# Patient Record
Sex: Female | Born: 1955 | Race: White | Hispanic: No | Marital: Married | Smoking: Current every day smoker
Health system: Southern US, Community
[De-identification: ages and names within clinical notes are randomized; demographics above are authoritative.]

## PROBLEM LIST (undated history)

## (undated) DIAGNOSIS — E079 Disorder of thyroid, unspecified: Secondary | ICD-10-CM

## (undated) HISTORY — PX: OTHER SURGICAL HISTORY: SHX169

## (undated) HISTORY — PX: THYROID SURGERY: SHX805

---

## 2015-10-10 ENCOUNTER — Ambulatory Visit: Payer: Self-pay | Attending: Internal Medicine

## 2015-10-10 ENCOUNTER — Ambulatory Visit (HOSPITAL_COMMUNITY)
Admission: EM | Admit: 2015-10-10 | Discharge: 2015-10-10 | Disposition: A | Discharge status: Home / Self Care | Payer: Self-pay | Attending: Emergency Medicine | Admitting: Emergency Medicine

## 2015-10-10 ENCOUNTER — Encounter (HOSPITAL_COMMUNITY): Payer: Self-pay | Admitting: *Deleted

## 2015-10-10 DIAGNOSIS — E890 Postprocedural hypothyroidism: Secondary | ICD-10-CM

## 2015-10-10 HISTORY — DX: Disorder of thyroid, unspecified: E07.9

## 2015-10-10 MED ORDER — THYROID 300 MG PO TABS: 300.0000 mg | ORAL_TABLET | Freq: Every day | ORAL | Status: DC

## 2015-10-10 NOTE — ED Notes (Signed)
Pt  Reports   She is  Out  Of  Her  thyriod  meds        She  Reports she  Has  Not  Had  Any  In  One month       She  Reports   Some  Swelling  Of  Her  Face        She  Is  Awake  And  Alert  And  Oriented

## 2015-10-10 NOTE — ED Provider Notes (Signed)
CSN: [id number]     Arrival date & time 10/10/15  1251 History   First MD Initiated Contact with Patient 10/10/15 1330     Chief Complaint  Patient presents with  . Medication Refill   (Consider location/radiation/quality/duration/timing/severity/associated sxs/prior Treatment) HPI Comments: 60 year old female who has a history of postsurgical hypothyroidism presents to the urgent care to have her refill of her thyroid supplement medication. She had been seeing he will physicians before various nonspecific reasons she is no longer seeing them again she [place] approximately 2 and half to 3 months ago and had lab drawn. She called back to get her prescription refilled from that provider and was told that she would have to be seen within 3 months and would be called back. Patient states she was never called back. She states she has no insurance and does not have the funds to have laboratory obtained or to pay a PCP at this time. Her only complaint is that of sleeping more than usual and a decrease in energy level. Her medication is armour thyroid 300 mg a day.   Past Medical History  Diagnosis Date  . Thyroid disease    Past Surgical History  Procedure Laterality Date  . Thyroid surgery    . Btl     History reviewed. No pertinent family history. Social History  Substance Use Topics  . Smoking status: Current Every Day Smoker  . Smokeless tobacco: None  . Alcohol Use: Yes   OB History    No data available     Review of Systems  Constitutional: Positive for activity change and fatigue. Negative for fever.  HENT: Negative.   Respiratory: Negative.   Cardiovascular: Negative.  Negative for leg swelling.  Endocrine: Negative for cold intolerance and heat intolerance.  Skin: Negative.   Neurological: Negative.   All other systems reviewed and are negative.   Allergies  Review of patient's allergies indicates no known allergies.  Home  Medications   Prior to Admission medications   Medication Sig Start Date End Date Taking? Authorizing Provider  thyroid (ARMOUR THYROID) 300 MG tablet Take 1 tablet (300 mg total) by mouth daily. 10/10/15   Hayden Rasmussenavid Amaryah Mallen, NP   Meds Ordered and Administered this Visit  Medications - No data to display  BP 120/75 mmHg  Pulse 78  Temp(Src) 98.1 F (36.7 C) (Oral)  Resp 18  Ht 5\' 10"  (1.778 m)  Wt 210 lb (95.255 kg)  BMI 30.13 kg/m2  SpO2 96% No data found.   Physical Exam  Constitutional: She is oriented to person, place, and time. She appears well-developed and well-nourished. No distress.  HENT:  Transverse scar across the lower anterior neck. No palpable nodules or tenderness. No anterior neck swelling.  Eyes: EOM are normal.  Neck: Normal range of motion. Neck supple.  Cardiovascular: Normal rate, regular rhythm, normal heart sounds and intact distal pulses.   Pulmonary/Chest: Effort normal and breath sounds normal. No respiratory distress.  Musculoskeletal: She exhibits no edema.  Neurological: She is alert and oriented to person, place, and time. She exhibits normal muscle tone.  Skin: Skin is warm and dry.  Psychiatric: She has a normal mood and affect.  Nursing note and vitals reviewed.   ED Course  Procedures (including critical care time)  Labs Review Labs Reviewed - No data to display  Imaging Review No results found.   Visual Acuity Review  Right Eye Distance:   Left Eye Distance:  Bilateral Distance:    Right Eye Near:   Left Eye Near:    Bilateral Near:         MDM   1. Hypothyroidism, postsurgical    There is a risk of taking medications prescribed by a health care provider without obtaining a complete history, labwork or proper physical exam, etc. This cannot be completed adequately at an urgent care. There can be multiple problems, some serious,  associated with medications and undetermined conditions of your health status. This action is  performed as a last resort in order to supply you with medication. By receiving these prescriptions you are ackowleging and accepting these risks and will not hold the prescriber or any agent of [facility] as responsible for any adverse outcomes.  Meds ordered this encounter  Medications  . DISCONTD: thyroid (ARMOUR) 300 MG tablet    Sig: Take 300 mg by mouth daily.  Marland Kitchen thyroid (ARMOUR THYROID) 300 MG tablet    Sig: Take 1 tablet (300 mg total) by mouth daily.    Dispense:  30 tablet    Refill:  0    Order Specific Question:  Supervising Provider    Answer:  Charm Rings [id number]   Follow with primary care doctor as soon as possible.    Hayden Rasmussen, NP 10/10/15 1358

## 2015-10-10 NOTE — Discharge Instructions (Signed)
There is a risk of taking medications prescribed by a health care provider without obtaining a complete history, labwork or proper physical exam, etc. This cannot be completed adequately at an urgent care. There can be multiple problems, some serious,  associated with medications and undetermined conditions of your health status. This action is performed as a last resort in order to supply you with medication. By receiving these prescriptions you are ackowleging and accepting these risks and will not hold the prescriber or any agent of [facility] as responsible for any adverse outcomes.    Hypothyroidism Hypothyroidism is a disorder of the thyroid. The thyroid is a large gland that is located in the lower front of the neck. The thyroid releases hormones that control how the body works. With hypothyroidism, the thyroid does not make enough of these hormones. CAUSES Causes of hypothyroidism may include:  Viral infections.  Pregnancy.  Your own defense system (immune system) attacking your thyroid.  Certain medicines.  Birth defects.  Past radiation treatments to your head or neck.  Past treatment with radioactive iodine.  Past surgical removal of part or all of your thyroid.  Problems with the gland that is located in the center of your brain (pituitary). SIGNS AND SYMPTOMS Signs and symptoms of hypothyroidism may include:  Feeling as though you have no energy (lethargy).  Inability to tolerate cold.  Weight gain that is not explained by a change in diet or exercise habits.  Dry skin.  Coarse hair.  Menstrual irregularity.  Slowing of thought processes.  Constipation.  Sadness or depression. DIAGNOSIS  Your health care provider may diagnose hypothyroidism with blood tests and ultrasound tests. TREATMENT Hypothyroidism is treated with medicine that replaces the hormones that your body does not make. After you begin treatment, it may take  several weeks for symptoms to go away. HOME CARE INSTRUCTIONS   Take medicines only as directed by your health care provider.  If you start taking any new medicines, tell your health care provider.  Keep all follow-up visits as directed by your health care provider. This is important. As your condition improves, your dosage needs may change. You will need to have blood tests regularly so that your health care provider can watch your condition. SEEK MEDICAL CARE IF:  Your symptoms do not get better with treatment.  You are taking thyroid replacement medicine and:  You sweat excessively.  You have tremors.  You feel anxious.  You lose weight rapidly.  You cannot tolerate heat.  You have emotional swings.  You have diarrhea.  You feel weak. SEEK IMMEDIATE MEDICAL CARE IF:   You develop chest pain.  You develop an irregular heartbeat.  You develop a rapid heartbeat.   This information is not intended to replace advice given to you by your health care provider. Make sure you discuss any questions you have with your health care provider.   Document Released: 03/10/2005 Document Revised: 03/31/2014 Document Reviewed: 07/26/2013 Elsevier Interactive Patient Education [employer]2016 [employer] Inc.

## 2016-05-26 ENCOUNTER — Encounter (HOSPITAL_COMMUNITY): Payer: Self-pay | Admitting: Emergency Medicine

## 2016-05-26 ENCOUNTER — Emergency Department (HOSPITAL_COMMUNITY)
Admission: EM | Admit: 2016-05-26 | Discharge: 2016-05-26 | Disposition: A | Discharge status: Home / Self Care | Attending: Emergency Medicine | Admitting: Emergency Medicine

## 2016-05-26 DIAGNOSIS — Z760 Encounter for issue of repeat prescription: Secondary | ICD-10-CM | POA: Diagnosis present

## 2016-05-26 DIAGNOSIS — Z79899 Other long term (current) drug therapy: Secondary | ICD-10-CM | POA: Insufficient documentation

## 2016-05-26 DIAGNOSIS — F17200 Nicotine dependence, unspecified, uncomplicated: Secondary | ICD-10-CM | POA: Diagnosis not present

## 2016-05-26 MED ORDER — THYROID 60 MG PO TABS: 300.0000 mg | ORAL_TABLET | Freq: Every day | ORAL | 0 refills | Status: DC

## 2016-05-26 NOTE — Discharge Instructions (Signed)
Take the prescribed medication as directed. Follow-up with your doctor in 2 weeks as scheduled. Return to the ED for new or worsening symptoms.

## 2016-05-26 NOTE — ED Provider Notes (Signed)
[facility]-EMERGENCY DEPT Provider Note   CSN: [id number] Arrival date & time: 05/26/16  1447  By signing my name below, I, Bonnie Franklin, attest that this documentation has been prepared under the direction and in the presence of Bonnie Sites, PA-C. Electronically Signed: Freida Franklin, Scribe. 05/26/2016. 4:38 PM.  History   Chief Complaint Chief Complaint  Patient presents with  . Medication Refill   The history is provided by the patient. No language interpreter was used.    HPI Comments:  Bonnie Franklin is a 61 y.o. female with a history of hypothyroidism, who presents to the Emergency Department for a medication refill. Pt states she ran out of armour thyroid 300 mg  ~ 3 weeks ago due to insurance issues. She notes she began rationing her pills awhile ago before she finally ran out.  Pt has been experiencing some fatigue which is common when she comes off of her medications.  She has insurance now and has an appointment to see new PCP in 2 weeks. She has no other acute complaints at this time.   Past Medical History:  Diagnosis Date  . Thyroid disease     There are no active problems to display for this patient.   Past Surgical History:  Procedure Laterality Date  . btl    . THYROID SURGERY      OB History    No data available       Home Medications    Prior to Admission medications   Medication Sig Start Date End Date Taking? Authorizing Provider  thyroid (ARMOUR THYROID) 300 MG tablet Take 1 tablet (300 mg total) by mouth daily. 10/10/15   Hayden Rasmussen, NP    Family History No family history on file.  Social History Social History  Substance Use Topics  . Smoking status: Current Every Day Smoker  . Smokeless tobacco: Never Used  . Alcohol use Yes     Allergies   Patient has no known allergies.   Review of Systems Review of Systems  Constitutional: Positive for fatigue. Negative for chills and fever.       +Medication refill  Respiratory: Negative for  shortness of breath.   Cardiovascular: Negative for chest pain.  All other systems reviewed and are negative.    Physical Exam Updated Vital Signs BP 164/93 (BP Location: Right Arm)   Pulse 74   Temp 98.1 F (36.7 C) (Oral)   Resp 17   SpO2 100%   Physical Exam  Constitutional: She is oriented to person, place, and time. She appears well-developed and well-nourished.  HENT:  Head: Normocephalic and atraumatic.  Mouth/Throat: Oropharynx is clear and moist.  Eyes: Conjunctivae and EOM are normal. Pupils are equal, round, and reactive to light.  Neck: Normal range of motion.  Thyroid scar noted, well healed  Cardiovascular: Normal rate, regular rhythm and normal heart sounds.   Pulmonary/Chest: Effort normal and breath sounds normal.  Abdominal: Soft. Bowel sounds are normal.  Musculoskeletal: Normal range of motion.  Neurological: She is alert and oriented to person, place, and time.  Skin: Skin is warm and dry.  Psychiatric: She has a normal mood and affect.  Nursing note and vitals reviewed.    ED Treatments / Results  DIAGNOSTIC STUDIES:  Oxygen Saturation is 100% on RA, normal by my interpretation.    COORDINATION OF CARE:  4:38 PM Discussed treatment plan with pt at bedside and pt agreed to plan.  Labs (all labs ordered are listed, but only abnormal results  are displayed) Labs Reviewed - No data to display  EKG  EKG Interpretation None       Radiology No results found.  Procedures Procedures (including critical care time)  Medications Ordered in ED Medications - No data to display   Initial Impression / Assessment and Plan / ED Course  I have reviewed the triage vital signs and the nursing notes.  Pertinent labs & imaging results that were available during my care of the patient were reviewed by me and considered in my medical decision making (see chart for details).  61 year old female here requesting refill of her Armour Thyroid. Has been on  this for several years following partial thyroidectomy. No issues with this medication, simply ran out 3 weeks ago. I refilled this for her for one month (requested 60mg  tablets as pharmacy usually does not have the 300mg  tablet). She is scheduled to see new PCP in 2 weeks, will continue following up with them for future refills.  Discussed plan with patient, she acknowledged understanding and agreed with plan of care.  Return precautions given for new or worsening symptoms.  Final Clinical Impressions(s) / ED Diagnoses   Final diagnoses:  Medication refill    New Prescriptions New Prescriptions   THYROID (ARMOUR THYROID) 60 MG TABLET    Take 5 tablets (300 mg total) by mouth daily before breakfast.   I personally performed the services described in this documentation, which was scribed in my presence. The recorded information has been reviewed and is accurate.     Garlon HatchetLisa M Djuna Frechette, PA-C 05/26/16 1728    Rolan BuccoMelanie Belfi, MD 05/26/16 [phone]

## 2016-05-26 NOTE — ED Triage Notes (Signed)
Patient been without insurance and having to get her medications from ED and now waiting to get into doctor office. Patient been out of thyroid medications for 3 weeks and needs prescription until gets in MD office in 2 weeks.

## 2016-05-26 NOTE — ED Notes (Signed)
Pt requesting for a prescription for her thyroid medicine, states she has been out for 3 weeks, and states that she does not have an appt until the 16th of this month.  No complaints at this time.

## 2016-09-01 ENCOUNTER — Emergency Department (HOSPITAL_COMMUNITY)

## 2016-09-01 ENCOUNTER — Emergency Department (HOSPITAL_COMMUNITY)
Admission: EM | Admit: 2016-09-01 | Discharge: 2016-09-01 | Disposition: A | Discharge status: Home / Self Care | Attending: Emergency Medicine | Admitting: Emergency Medicine

## 2016-09-01 ENCOUNTER — Encounter (HOSPITAL_COMMUNITY): Payer: Self-pay | Admitting: Emergency Medicine

## 2016-09-01 DIAGNOSIS — K8020 Calculus of gallbladder without cholecystitis without obstruction: Secondary | ICD-10-CM | POA: Diagnosis not present

## 2016-09-01 DIAGNOSIS — Z79899 Other long term (current) drug therapy: Secondary | ICD-10-CM | POA: Insufficient documentation

## 2016-09-01 DIAGNOSIS — M545 Low back pain, unspecified: Secondary | ICD-10-CM

## 2016-09-01 DIAGNOSIS — F17200 Nicotine dependence, unspecified, uncomplicated: Secondary | ICD-10-CM | POA: Insufficient documentation

## 2016-09-01 DIAGNOSIS — G8929 Other chronic pain: Secondary | ICD-10-CM

## 2016-09-01 LAB — URINALYSIS, ROUTINE W REFLEX MICROSCOPIC
BILIRUBIN URINE: NEGATIVE
Glucose, UA: NEGATIVE mg/dL
HGB URINE DIPSTICK: NEGATIVE
KETONES UR: NEGATIVE mg/dL
NITRITE: NEGATIVE
Protein, ur: NEGATIVE mg/dL
SPECIFIC GRAVITY, URINE: 1.008 (ref 1.005–1.030)
pH: 5 (ref 5.0–8.0)

## 2016-09-01 LAB — CBC WITH DIFFERENTIAL/PLATELET
Basophils Absolute: 0 10*3/uL (ref 0.0–0.1)
Basophils Relative: 0 %
Eosinophils Absolute: 0.1 10*3/uL (ref 0.0–0.7)
Eosinophils Relative: 1 %
HCT: 41.5 % (ref 36.0–46.0)
Hemoglobin: 14.5 g/dL (ref 12.0–15.0)
Lymphocytes Relative: 37 %
Lymphs Abs: 2.7 10*3/uL (ref 0.7–4.0)
MCH: 32.5 pg (ref 26.0–34.0)
MCHC: 34.9 g/dL (ref 30.0–36.0)
MCV: 93 fL (ref 78.0–100.0)
Monocytes Absolute: 0.3 10*3/uL (ref 0.1–1.0)
Monocytes Relative: 3 %
Neutro Abs: 4.3 10*3/uL (ref 1.7–7.7)
Neutrophils Relative %: 59 %
Platelets: 179 10*3/uL (ref 150–400)
RBC: 4.46 MIL/uL (ref 3.87–5.11)
RDW: 12.7 % (ref 11.5–15.5)
WBC: 7.4 10*3/uL (ref 4.0–10.5)

## 2016-09-01 LAB — COMPREHENSIVE METABOLIC PANEL
ALT: 29 U/L (ref 14–54)
AST: 28 U/L (ref 15–41)
Albumin: 4.4 g/dL (ref 3.5–5.0)
Alkaline Phosphatase: 63 U/L (ref 38–126)
Anion gap: 5 (ref 5–15)
BUN: 12 mg/dL (ref 6–20)
CO2: 26 mmol/L (ref 22–32)
Calcium: 9.1 mg/dL (ref 8.9–10.3)
Chloride: 108 mmol/L (ref 101–111)
Creatinine, Ser: 0.86 mg/dL (ref 0.44–1.00)
GFR calc Af Amer: >60 mL/min (ref >60)
GFR calc non Af Amer: >60 mL/min (ref >60)
Glucose, Bld: 91 mg/dL (ref 65–99)
Potassium: 4 mmol/L (ref 3.5–5.1)
Sodium: 139 mmol/L (ref 135–145)
Total Bilirubin: 0.7 mg/dL (ref 0.3–1.2)
Total Protein: 7.4 g/dL (ref 6.5–8.1)

## 2016-09-01 MED ORDER — IOPAMIDOL (ISOVUE-300) INJECTION 61%
INTRAVENOUS | Status: AC
  Filled 2016-09-01: qty 100

## 2016-09-01 MED ORDER — IOPAMIDOL (ISOVUE-300) INJECTION 61%
100.0000 mL | Freq: Once | INTRAVENOUS | Status: AC | PRN
  Administered 2016-09-01: 100 mL via INTRAVENOUS

## 2016-09-01 NOTE — ED Triage Notes (Signed)
Per pt, states lower back pain for months-states now having urinary frequency-multiple complaints

## 2016-09-01 NOTE — Discharge Instructions (Signed)
Please read attached information. If you experience any new or worsening signs or symptoms please return to the emergency room for evaluation. Please follow-up with your primary care provider or specialist as discussed.  °

## 2016-09-01 NOTE — ED Provider Notes (Signed)
[facility]-EMERGENCY DEPT Provider Note   CSN: [id number] Arrival date & time: 09/01/16  1556    History   Chief Complaint Chief Complaint  Patient presents with  . Back Pain    HPI Bonnie Franklin is a 61 y.o. female.  HPI   61 year old female presents today with complaints of back pain. Patient notes symptoms started approximately 6 months ago. She ascribes this pain as an ache in the right lower back and upper hip. She attributed this to musculoskeletal pain and thought it would dissipate on its own. She notes the pain is worsened, and is now worsened by ambulation. She notes occasionally she has sharp shooting pain from her lower back and flank into her right hip. She notes that waking in the morning she has what she feels like pressure in her lower back that is improved with bowel movement. She notes 3 weeks ago she started to have cloudy urine, she was referred to urologist for this but does not have an appointment scheduled yet. Patient notes some minor pain in her right lower quadrant today. She denies any nausea vomiting, malodorous urine, or blood per rectum.Patient notes that she is been fatigued recently as well, she denies any acute shortness of breath or chest pain. She denies any lower extremity swelling or edema.   Past Medical History:  Diagnosis Date  . Thyroid disease     There are no active problems to display for this patient.   Past Surgical History:  Procedure Laterality Date  . btl    . THYROID SURGERY      OB History    No data available      Home Medications    Prior to Admission medications   Medication Sig Start Date End Date Taking? Authorizing Provider  diclofenac sodium (VOLTAREN) 1 % GEL Apply 2 g topically daily as needed (pain).   Yes [provider]  ibuprofen (ADVIL,MOTRIN) 200 MG tablet Take 400 mg by mouth every 6 (six) hours as needed for moderate pain.   Yes [provider]  thyroid (ARMOUR THYROID) 60 MG tablet Take 5  tablets (300 mg total) by mouth daily before breakfast. 05/26/16  Yes Garlon Hatchet, PA-C    Family History No family history on file.  Social History Social History  Substance Use Topics  . Smoking status: Current Every Day Smoker  . Smokeless tobacco: Never Used  . Alcohol use Yes     Allergies   Patient has no known allergies.   Review of Systems Review of Systems  All other systems reviewed and are negative.  Physical Exam Updated Vital Signs BP (!) 156/74 (BP Location: Right Arm)   Pulse (!) 59   Temp 97.9 F (36.6 C) (Oral)   Resp 20   Ht 5\' 11"  (1.803 m)   Wt 114.3 kg (252 lb)   SpO2 97%   BMI 35.15 kg/m   Physical Exam  Constitutional: She is oriented to person, place, and time. She appears well-developed and well-nourished.  HENT:  Head: Normocephalic and atraumatic.  Eyes: Conjunctivae are normal. Pupils are equal, round, and reactive to light. Right eye exhibits no discharge. Left eye exhibits no discharge. No scleral icterus.  Neck: Normal range of motion. No JVD present. No tracheal deviation present.  Pulmonary/Chest: Effort normal. No stridor.  Abdominal: Soft. She exhibits no distension.  TTP of right mid abd- remainder of abdominal exam benign  Neurological: She is alert and oriented to person, place, and time. Coordination normal.  Psychiatric: She has a normal mood and affect. Her behavior is normal. Judgment and thought content normal.  Nursing note and vitals reviewed.    ED Treatments / Results  Labs (all labs ordered are listed, but only abnormal results are displayed) Labs Reviewed  URINALYSIS, ROUTINE W REFLEX MICROSCOPIC - Abnormal; Notable for the following:       Result Value   APPearance HAZY (*)    Leukocytes, UA TRACE (*)    Bacteria, UA MANY (*)    Squamous Epithelial / LPF 0-5 (*)    All other components within normal limits  URINE CULTURE  CBC WITH DIFFERENTIAL/PLATELET  COMPREHENSIVE METABOLIC PANEL    EKG  EKG  Interpretation None       Radiology Ct Abdomen Pelvis W Contrast  Result Date: 09/01/2016 CLINICAL DATA:  Chronic lower back pain and acute onset of increased urinary frequency. Initial encounter. EXAM: CT ABDOMEN AND PELVIS WITH CONTRAST TECHNIQUE: Multidetector CT imaging of the abdomen and pelvis was performed using the standard protocol following bolus administration of intravenous contrast. CONTRAST:  100mL ISOVUE-300 IOPAMIDOL (ISOVUE-300) INJECTION 61% COMPARISON:  None. FINDINGS: Lower chest: The visualized lung bases are grossly clear. The visualized portions of the mediastinum are unremarkable. Hepatobiliary: The liver is unremarkable in appearance. Stones are noted within the gallbladder. The gallbladder is otherwise unremarkable. The common bile duct remains normal in caliber. Pancreas: The pancreas is within normal limits. Spleen: The spleen is unremarkable in appearance. Adrenals/Urinary Tract: The adrenal glands are unremarkable in appearance. The kidneys are within normal limits. There is no evidence of hydronephrosis. No renal or ureteral stones are identified. No perinephric stranding is seen. Stomach/Bowel: The stomach is unremarkable in appearance. The small bowel is within normal limits. The appendix is normal in caliber, without evidence of appendicitis. Diffuse diverticulosis is noted along the descending and sigmoid colon, without evidence of diverticulitis. Vascular/Lymphatic: Scattered calcification is seen along the abdominal aorta and its branches. The abdominal aorta is otherwise grossly unremarkable. The inferior vena cava is grossly unremarkable. No retroperitoneal lymphadenopathy is seen. No pelvic sidewall lymphadenopathy is identified. Reproductive: The bladder is mildly distended and grossly remarkable. The uterus is unremarkable in appearance. The ovaries are relatively symmetric. No suspicious adnexal masses are seen. Other: No additional soft tissue abnormalities are  seen. Musculoskeletal: No acute osseous abnormalities are identified. Vacuum phenomenon is noted at L5-S1. The visualized musculature is unremarkable in appearance. IMPRESSION: 1. No acute abnormality seen within the abdomen or pelvis. 2. Cholelithiasis.  Gallbladder otherwise unremarkable. 3. Scattered aortic atherosclerosis. 4. Diffuse diverticulosis along the descending and sigmoid colon, without evidence of diverticulitis. Electronically Signed   By: Roanna RaiderJeffery  Chang M.D.   On: 09/01/2016 21:57    Procedures Procedures (including critical care time)  Medications Ordered in ED Medications  iopamidol (ISOVUE-300) 61 % injection (not administered)  iopamidol (ISOVUE-300) 61 % injection 100 mL (100 mLs Intravenous Contrast Given 09/01/16 2137)     Initial Impression / Assessment and Plan / ED Course  I have reviewed the triage vital signs and the nursing notes.  Pertinent labs & imaging results that were available during my care of the patient were reviewed by me and considered in my medical decision making (see chart for details).      Final Clinical Impressions(s) / ED Diagnoses   Final diagnoses:  Chronic right-sided low back pain without sciatica  Calculus of gallbladder without cholecystitis without obstruction    Labs: cbc, cmp, urine Culture, urinalysis  Imaging: CT abdomen pelvis  with contrast  Consults:  Therapeutics:  Discharge Meds:   Assessment/Plan: 61 year old female presents today with right lower back pain. This is chronic in nature, she has no red flags. Patient also has mid right abdominal discomfort with palpation. Patient's symptoms are nonacute in nature. She has reassuring laboratory analysis, no significant findings on her CT scan. All results including cholelithiasis were read to the patient. She does not have acute signs of urinary tract infection at this time. Does note a change in color of her urine, but denies any specific dysuria. Patient will follow up  with her primary care for reevaluation and ongoing management.       New Prescriptions New Prescriptions   No medications on file     Rosalio Loud 09/01/16 2310    Mancel Bale, MD 09/01/16 [phone]

## 2016-09-03 LAB — URINE CULTURE

## 2017-07-29 ENCOUNTER — Ambulatory Visit: Payer: BLUE CROSS/BLUE SHIELD | Admitting: Podiatry

## 2017-08-06 ENCOUNTER — Ambulatory Visit: Payer: BLUE CROSS/BLUE SHIELD | Admitting: Podiatry

## 2017-08-21 ENCOUNTER — Ambulatory Visit: Payer: BLUE CROSS/BLUE SHIELD | Admitting: Podiatry

## 2017-08-21 ENCOUNTER — Encounter: Payer: Self-pay | Admitting: Podiatry

## 2017-08-21 ENCOUNTER — Ambulatory Visit (INDEPENDENT_AMBULATORY_CARE_PROVIDER_SITE_OTHER): Payer: BLUE CROSS/BLUE SHIELD

## 2017-08-21 DIAGNOSIS — G5790 Unspecified mononeuropathy of unspecified lower limb: Secondary | ICD-10-CM

## 2017-08-21 DIAGNOSIS — M722 Plantar fascial fibromatosis: Secondary | ICD-10-CM

## 2017-08-21 DIAGNOSIS — M79673 Pain in unspecified foot: Secondary | ICD-10-CM

## 2017-08-21 MED ORDER — GABAPENTIN 100 MG PO CAPS
100.0000 mg | ORAL_CAPSULE | Freq: Three times a day (TID) | ORAL | 3 refills | Status: DC
Start: 2017-08-21 — End: 2023-11-25

## 2017-08-24 NOTE — Progress Notes (Signed)
   Subjective: 62 year old female presenting today as a new patient with a chief complaint of constant burning pain in bilateral feet, left worse than right, that began 5-6 months ago. She reports associated numbness and tingling. She also reports thickened and discolored nails and calluses from wearing steel-toed boots everyday for work. Walking and standing increases her pain. She has been taking Tylenol, Ibuprofen and soaking the feet in Epsom salt for treatment. Patient is here for further evaluation and treatment.   Past Medical History:  Diagnosis Date  . Thyroid disease      Objective: Physical Exam General: The patient is alert and oriented x3 in no acute distress.  Dermatology: Skin is warm, dry and supple bilateral lower extremities. Negative for open lesions or macerations bilateral.   Vascular: Dorsalis Pedis and Posterior Tibial pulses palpable bilateral.  Capillary fill time is immediate to all digits.  Neurological: Epicritic and protective threshold intact bilateral. Paresthesia and burning sensation noted to bilateral feet.   Musculoskeletal: Tenderness to palpation to the plantar aspect of the bilateral heels along the plantar fascia. All other joints range of motion within normal limits bilateral. Strength 5/5 in all groups bilateral.   Radiographic exam: Normal osseous mineralization. Joint spaces preserved. No fracture/dislocation/boney destruction. No other soft tissue abnormalities or radiopaque foreign bodies.   Assessment: 1. plantar fasciitis bilateral feet 2. Neuritis bilateral feet   Plan of Care:  1. Patient evaluated. Xrays reviewed.   2. Appointment with Raiford Nobleick for custom molded orthotics.  3. Prescription for gabapentin 100 mg to be taken every night at bedtime.  4. Return to clinic as needed.   Felecia ShellingBrent M. Brewer Hitchman, DPM [facility]  Dr. Felecia ShellingBrent M. Ralston Venus, DPM    [address]                                   [place], [place] [id number]                 Office [phone]  Fax [phone]

## 2017-08-26 ENCOUNTER — Ambulatory Visit (INDEPENDENT_AMBULATORY_CARE_PROVIDER_SITE_OTHER): Payer: BLUE CROSS/BLUE SHIELD | Admitting: Orthotics

## 2017-08-26 DIAGNOSIS — G5790 Unspecified mononeuropathy of unspecified lower limb: Secondary | ICD-10-CM

## 2017-08-26 DIAGNOSIS — M722 Plantar fascial fibromatosis: Secondary | ICD-10-CM | POA: Diagnosis not present

## 2017-08-26 NOTE — Progress Notes (Signed)

## 2017-09-16 ENCOUNTER — Ambulatory Visit (INDEPENDENT_AMBULATORY_CARE_PROVIDER_SITE_OTHER): Payer: BLUE CROSS/BLUE SHIELD | Admitting: Orthotics

## 2017-09-16 DIAGNOSIS — M722 Plantar fascial fibromatosis: Secondary | ICD-10-CM

## 2017-09-16 NOTE — Progress Notes (Signed)
Patient came in today to pick up custom made foot orthotics.  The goals were accomplished and the patient reported no dissatisfaction with said orthotics.  Patient was advised of breakin period and how to report any issues. 

## 2017-10-07 ENCOUNTER — Other Ambulatory Visit: Payer: Self-pay | Admitting: Podiatry

## 2017-10-07 DIAGNOSIS — M722 Plantar fascial fibromatosis: Secondary | ICD-10-CM

## 2018-02-16 IMAGING — CT CT ABD-PELV W/ CM
2 of 5 series · 16 of 46 positions shown, 18 images · IV contrast (ISOVUE)
Comparison: None.

CLINICAL DATA: Chronic lower back pain and acute onset of increased
urinary frequency. Initial encounter.

EXAM:
CT ABDOMEN AND PELVIS WITH CONTRAST
TECHNIQUE: Multidetector CT imaging of the abdomen and pelvis was performed
using the standard protocol following bolus administration of
intravenous contrast.
CONTRAST:  100mL H7VCB7-100 IOPAMIDOL (H7VCB7-100) INJECTION 61%

[Series 2: abd/pel with · axial · 0.77mm/px · z∈[-496,-86]mm · 13 of 96 slices shown, 15 images]
[im 7/96  soft-tissue]
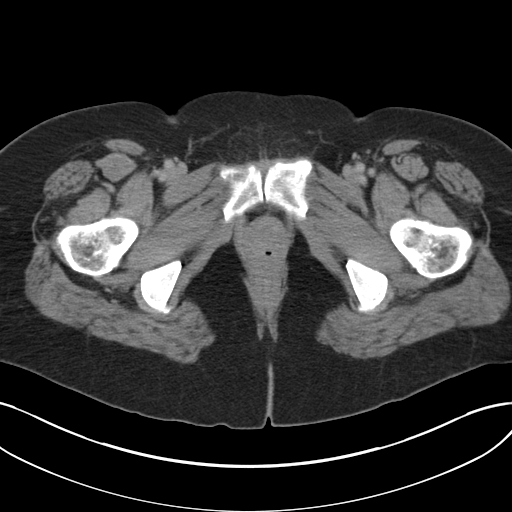
[im 7/96  bone]
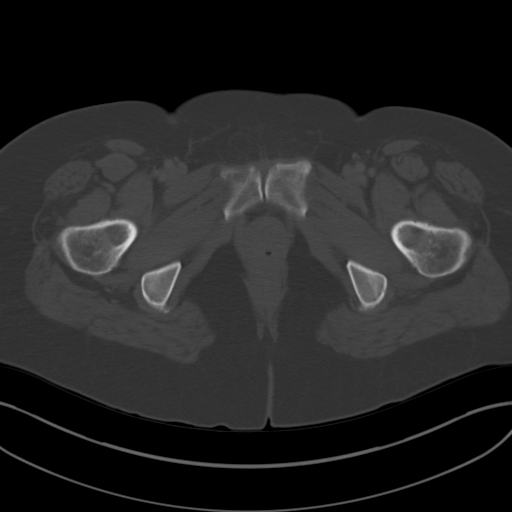
[im 14/96  soft-tissue]
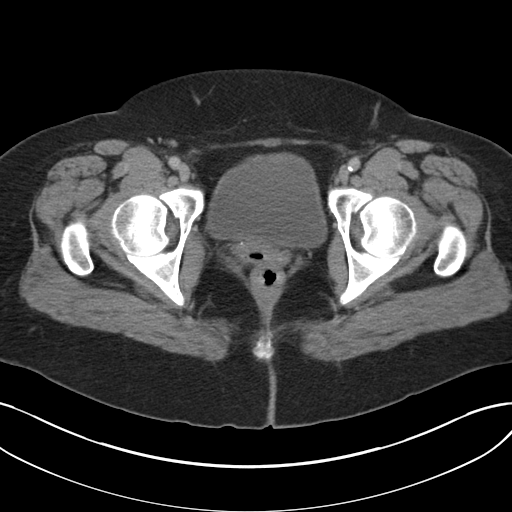
[im 21/96  soft-tissue]
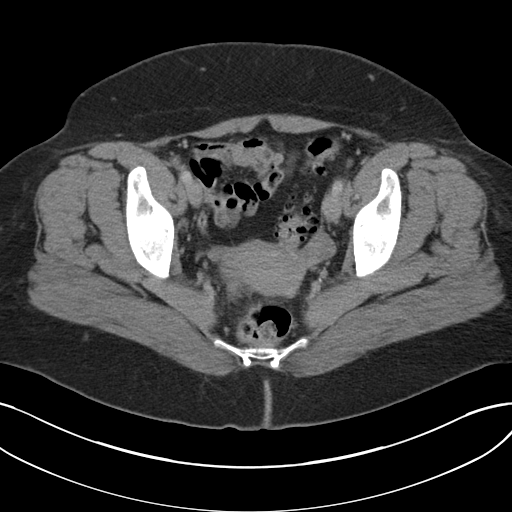
[im 28/96  soft-tissue]
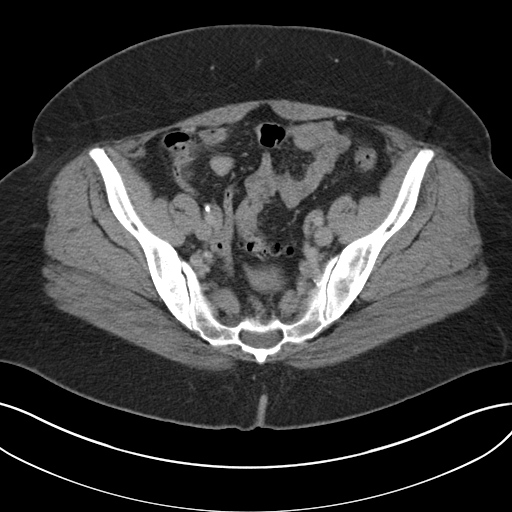
[im 34/96  soft-tissue]
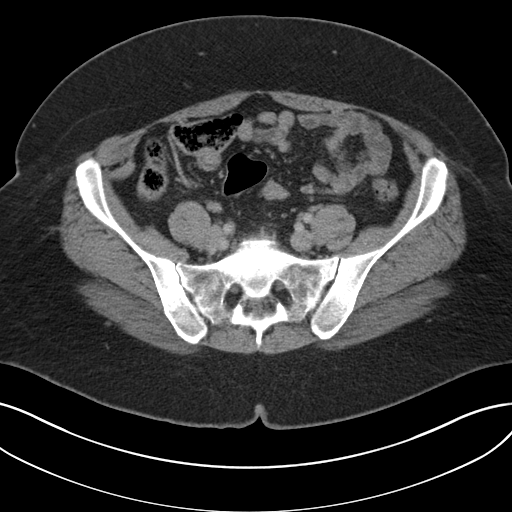
[im 41/96  soft-tissue]
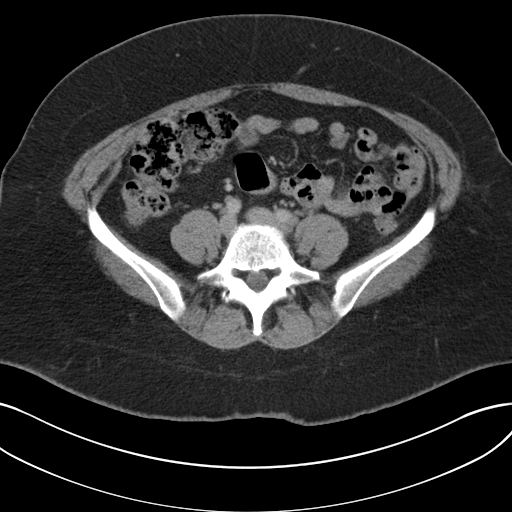
[im 48/96  soft-tissue]
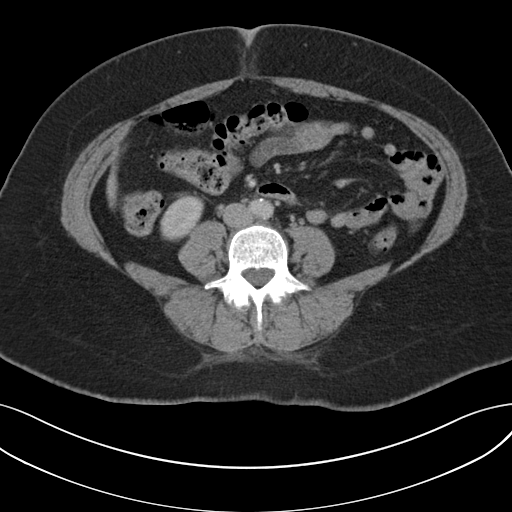
[im 55/96  soft-tissue]
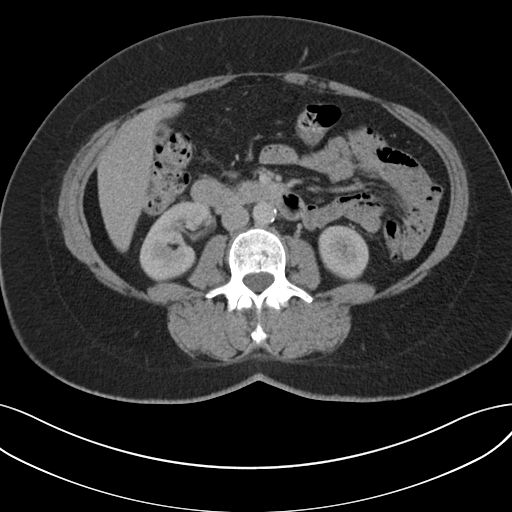
[im 62/96  soft-tissue]
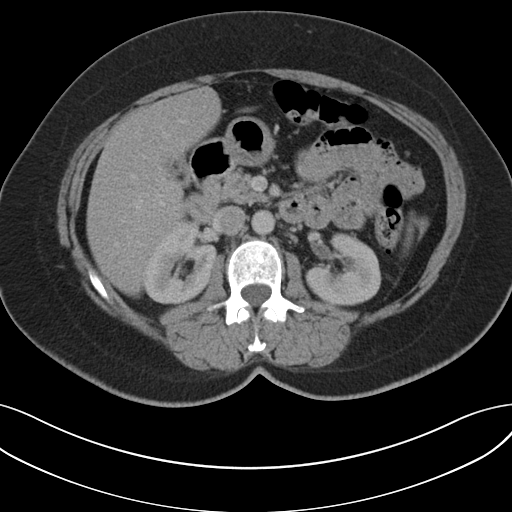
[im 62/96  bone]
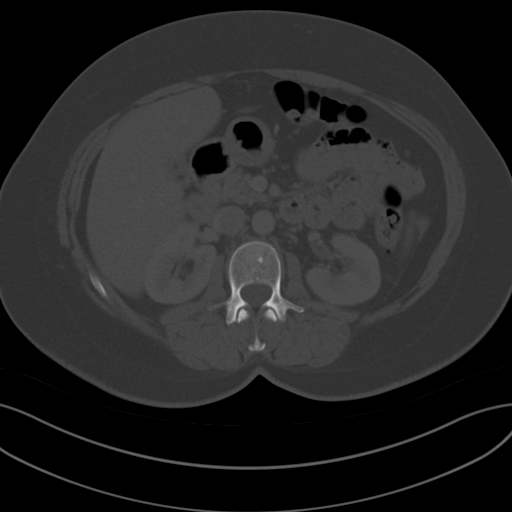
[im 68/96  soft-tissue]
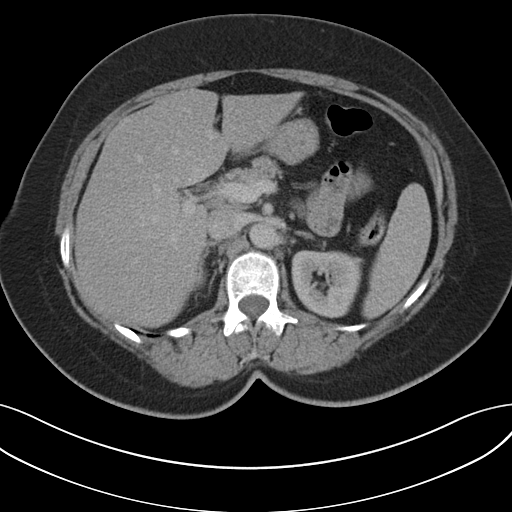
[im 75/96  soft-tissue]
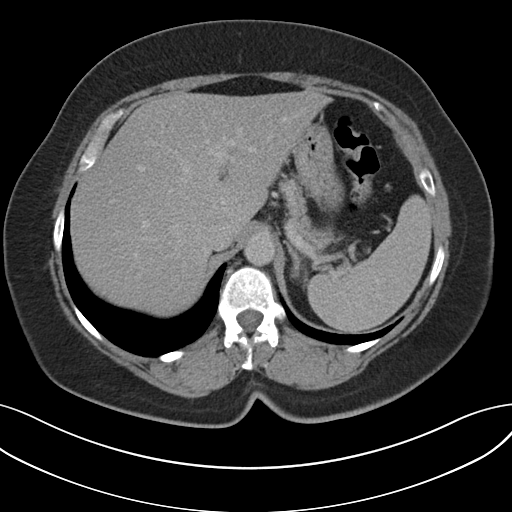
[im 82/96  soft-tissue]
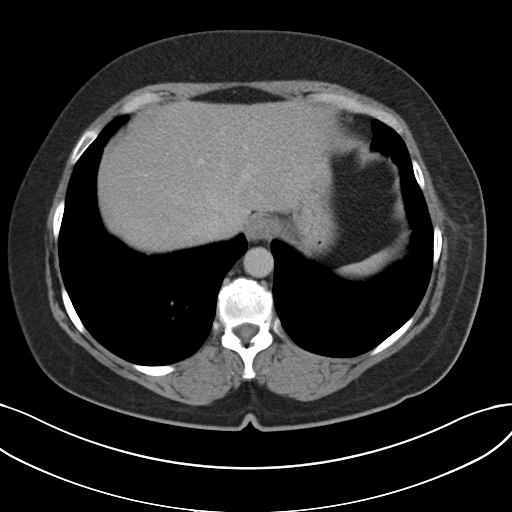
[im 89/96  soft-tissue]
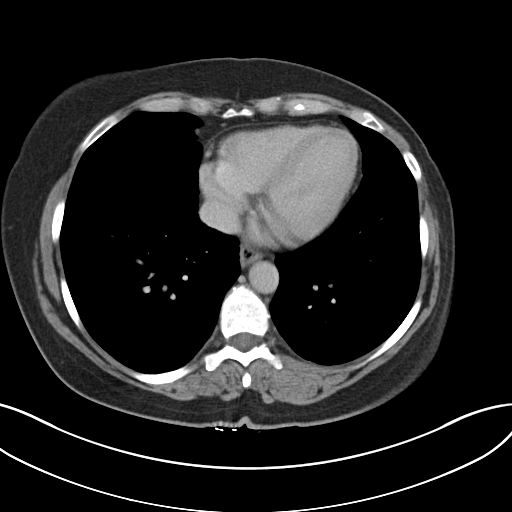

[Series 4: coronal a/|p · coronal · 0.83mm/px · 3 of 164 slices shown]
[im 55/164  soft-tissue]
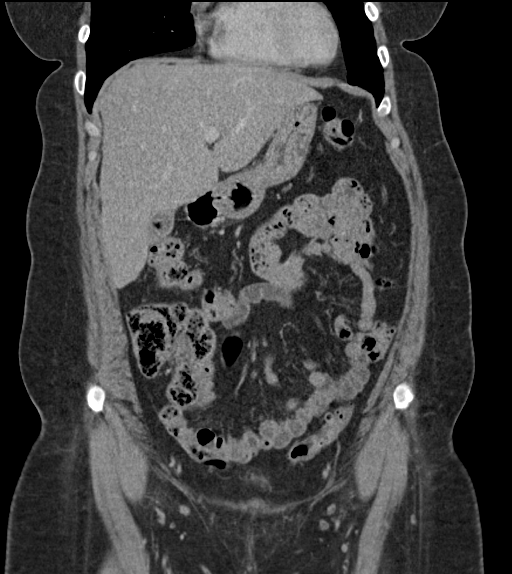
[im 73/164  soft-tissue]
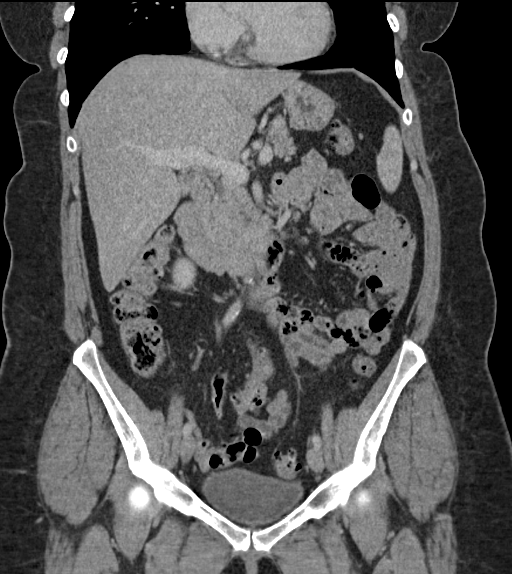
[im 91/164  soft-tissue]
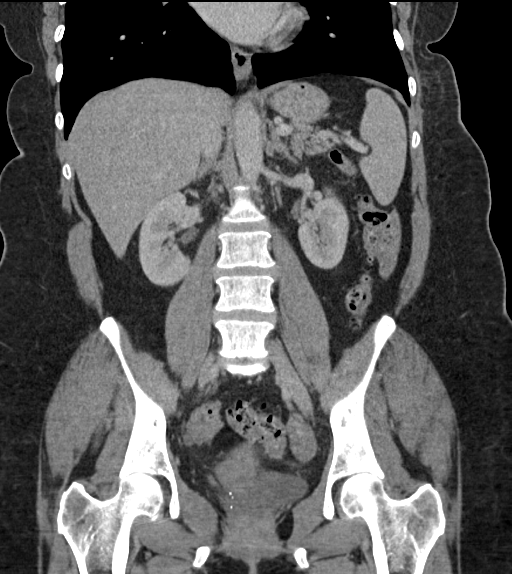

[16 of 46 positions shown; findings below may reference images not displayed]

FINDINGS: Lower chest: The visualized lung bases are grossly clear. The
visualized portions of the mediastinum are unremarkable.

Hepatobiliary: The liver is unremarkable in appearance. Stones are
noted within the gallbladder. The gallbladder is otherwise
unremarkable. The common bile duct remains normal in caliber.

Pancreas: The pancreas is within normal limits.

Spleen: The spleen is unremarkable in appearance.

Adrenals/Urinary Tract: The adrenal glands are unremarkable in
appearance. The kidneys are within normal limits. There is no
evidence of hydronephrosis. No renal or ureteral stones are
identified. No perinephric stranding is seen.

Stomach/Bowel: The stomach is unremarkable in appearance. The small
bowel is within normal limits. The appendix is normal in caliber,
without evidence of appendicitis.

Diffuse diverticulosis is noted along the descending and sigmoid
colon, without evidence of diverticulitis.

Vascular/Lymphatic: Scattered calcification is seen along the
abdominal aorta and its branches. The abdominal aorta is otherwise
grossly unremarkable. The inferior vena cava is grossly
unremarkable. No retroperitoneal lymphadenopathy is seen. No pelvic
sidewall lymphadenopathy is identified.

Reproductive: The bladder is mildly distended and grossly
remarkable. The uterus is unremarkable in appearance. The ovaries
are relatively symmetric. No suspicious adnexal masses are seen.

Other: No additional soft tissue abnormalities are seen.

Musculoskeletal: No acute osseous abnormalities are identified.
Vacuum phenomenon is noted at L5-S1. The visualized musculature is
unremarkable in appearance.
IMPRESSION: 1. No acute abnormality seen within the abdomen or pelvis.
2. Cholelithiasis.  Gallbladder otherwise unremarkable.
3. Scattered aortic atherosclerosis.
4. Diffuse diverticulosis along the descending and sigmoid colon,
without evidence of diverticulitis.

## 2018-11-29 ENCOUNTER — Other Ambulatory Visit: Payer: Self-pay | Admitting: Podiatry

## 2020-11-29 LAB — COLOGUARD: COLOGUARD: NEGATIVE

## 2021-01-08 ENCOUNTER — Ambulatory Visit: Payer: BLUE CROSS/BLUE SHIELD | Admitting: Internal Medicine

## 2021-02-08 ENCOUNTER — Other Ambulatory Visit: Payer: Self-pay | Admitting: Family Medicine

## 2021-02-08 DIAGNOSIS — R110 Nausea: Secondary | ICD-10-CM

## 2021-02-08 DIAGNOSIS — R1011 Right upper quadrant pain: Secondary | ICD-10-CM

## 2021-02-12 ENCOUNTER — Other Ambulatory Visit: Payer: Self-pay

## 2021-02-12 ENCOUNTER — Ambulatory Visit
Admission: RE | Admit: 2021-02-12 | Discharge: 2021-02-12 | Disposition: A | Discharge status: Home / Self Care | Source: Ambulatory Visit | Attending: Family Medicine | Admitting: Family Medicine

## 2021-02-12 DIAGNOSIS — R110 Nausea: Secondary | ICD-10-CM | POA: Diagnosis present

## 2021-02-12 DIAGNOSIS — R1011 Right upper quadrant pain: Secondary | ICD-10-CM | POA: Diagnosis not present

## 2022-07-30 IMAGING — US US ABDOMEN LIMITED
1 series · 14 of 25 positions shown · non-contrast
Comparison: CT done on 09/01/2016

CLINICAL DATA: Pain right upper quadrant

EXAM:
ULTRASOUND ABDOMEN LIMITED RIGHT UPPER QUADRANT

[Series 1: us abdomen limited · 0.23mm/px · 14 of 64 slices shown]
[im 1/64]
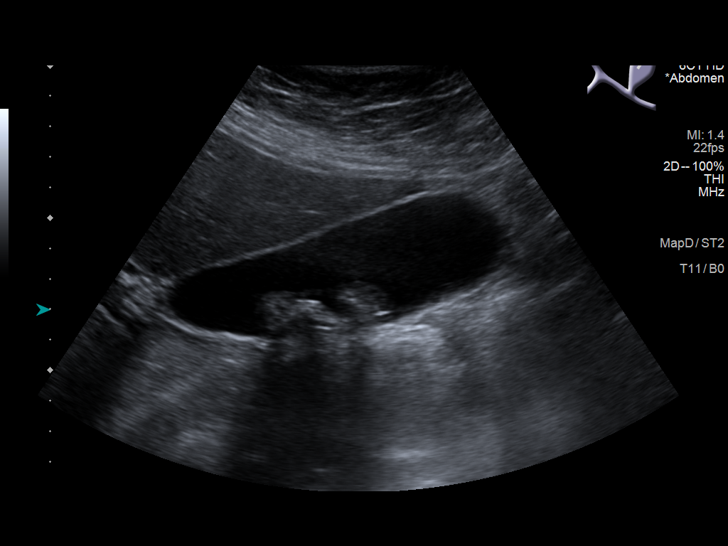
[im 6/64]
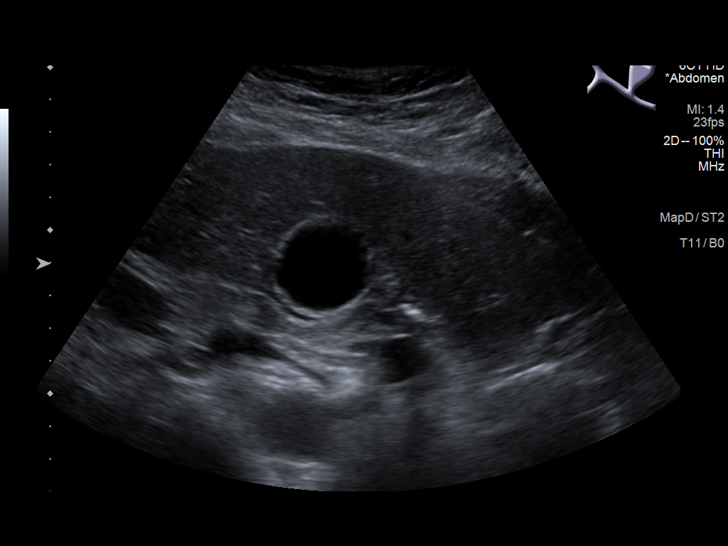
[im 11/64]
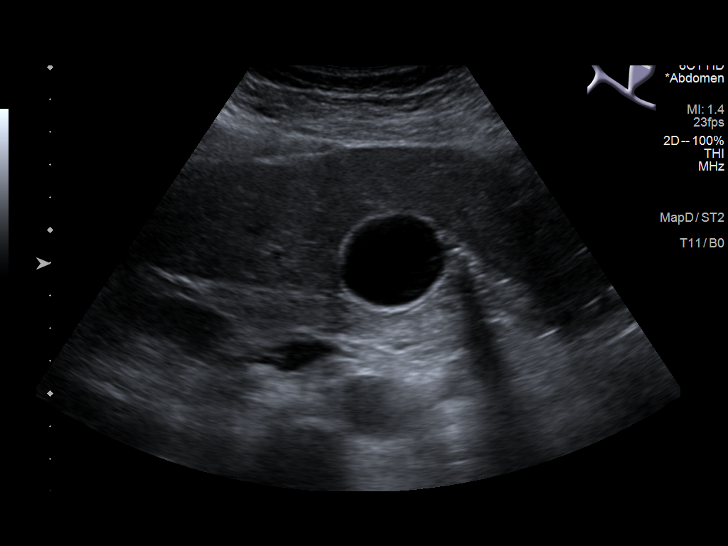
[im 16/64]
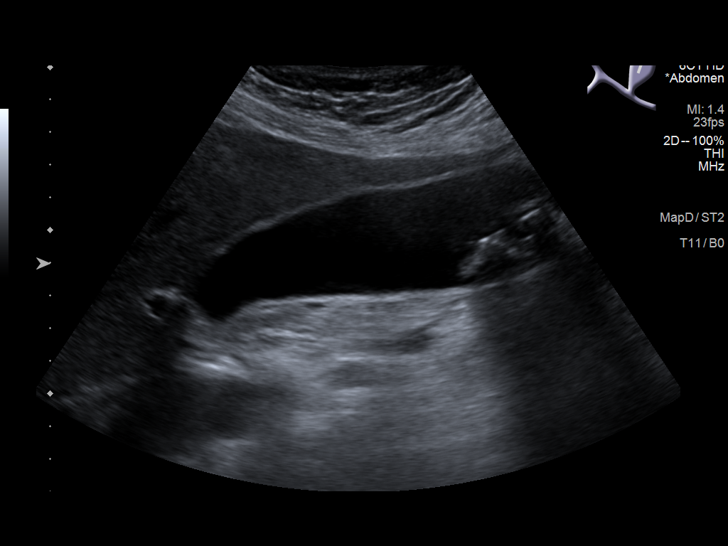
[im 22/64]
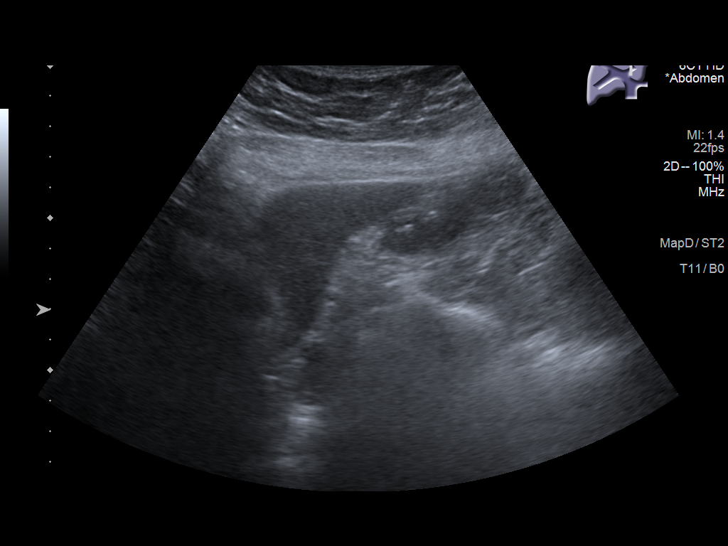
[im 24/64]
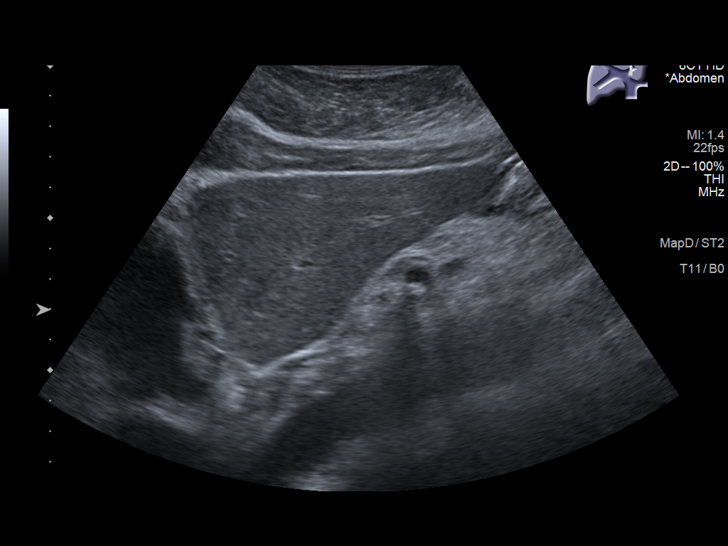
[im 29/64]
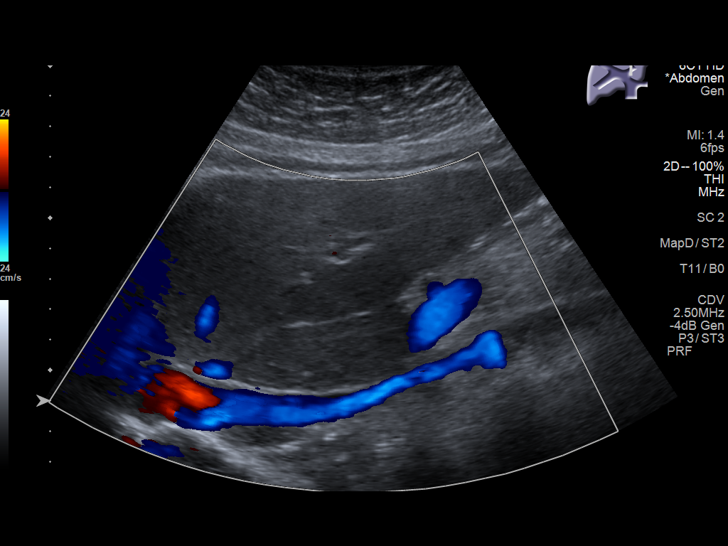
[im 35/64]
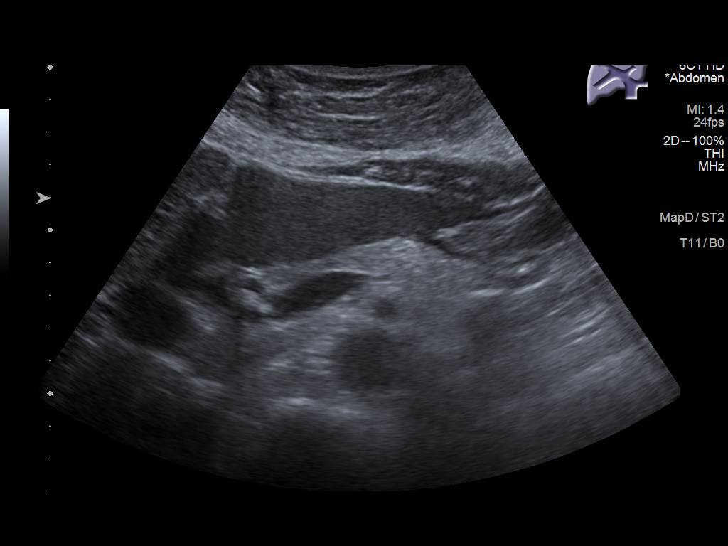
[im 40/64]
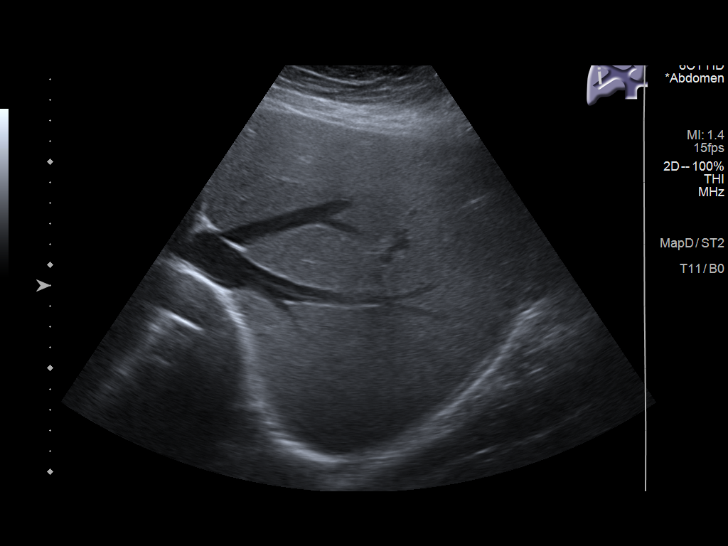
[im 43/64]
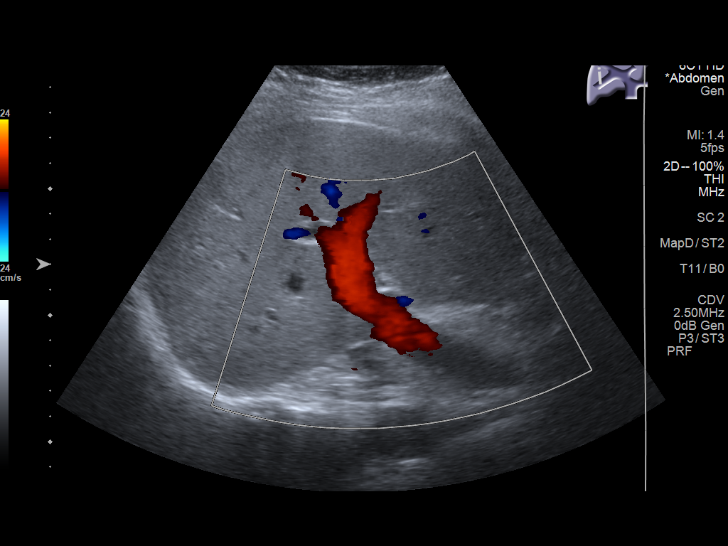
[im 48/64]
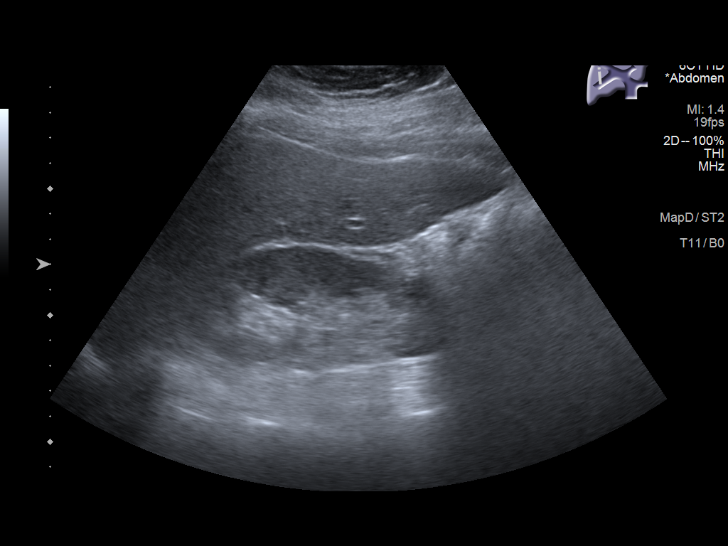
[im 53/64]
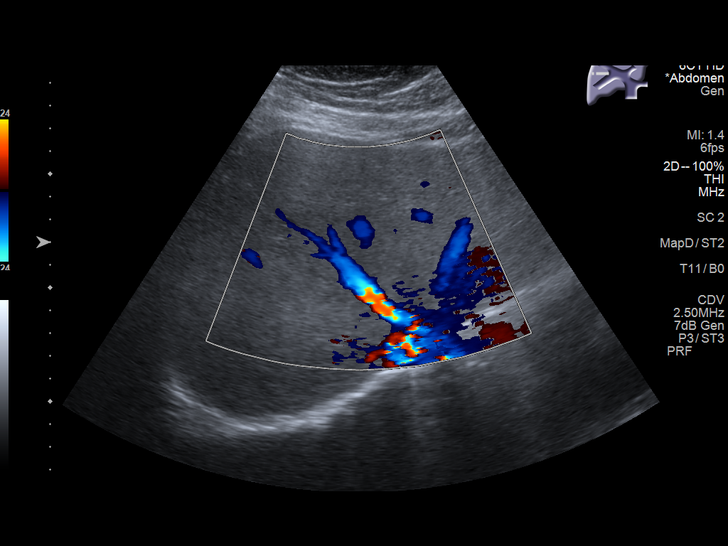
[im 58/64]
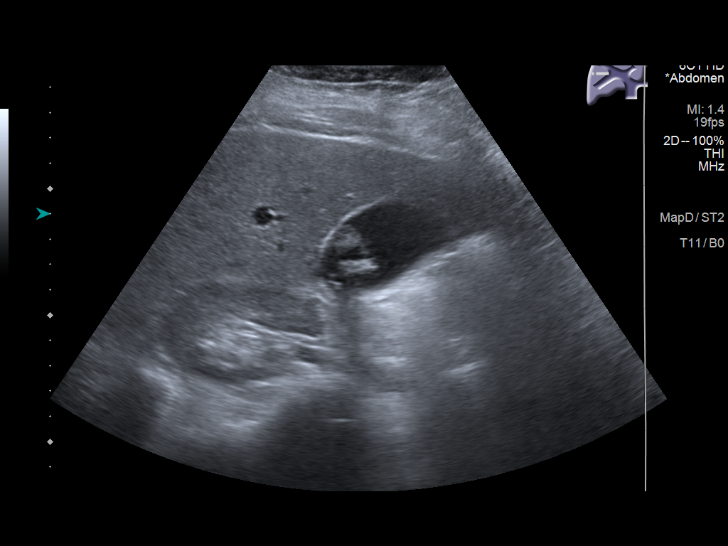
[im 64/64]
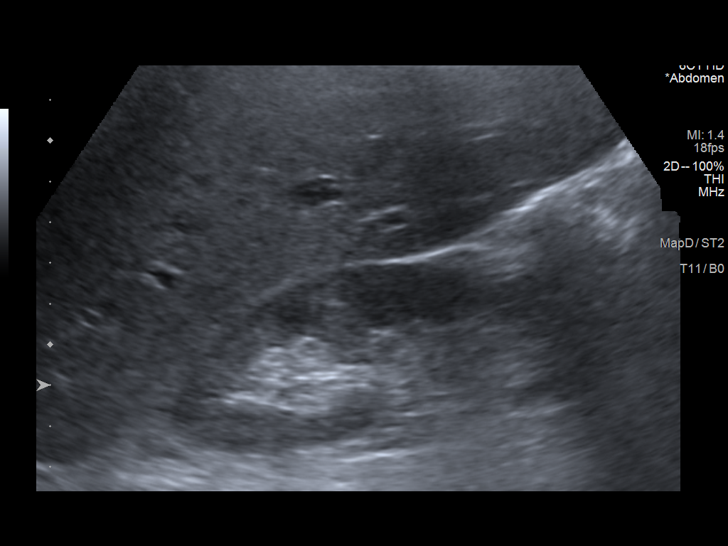

[14 of 25 positions shown; findings below may reference images not displayed]

FINDINGS: Gallbladder:

There are multiple gallbladder stones. There are no sonographic
signs of acute cholecystitis. There is no evidence of
pericholecystic fluid. Technologist did not observe any tenderness
over the gallbladder during the study.

Common bile duct:

Diameter: 3.6 mm

Liver:

No focal lesion identified. Within normal limits in parenchymal
echogenicity. Portal vein is patent on color Doppler imaging with
normal direction of blood flow towards the liver.

Other: None.
IMPRESSION: Gallbladder stones. There are no sonographic signs of acute
cholecystitis. There is no dilation of bile ducts.

## 2023-08-13 ENCOUNTER — Emergency Department

## 2023-08-13 ENCOUNTER — Other Ambulatory Visit: Payer: Self-pay

## 2023-08-13 DIAGNOSIS — R5383 Other fatigue: Secondary | ICD-10-CM | POA: Insufficient documentation

## 2023-08-13 DIAGNOSIS — I10 Essential (primary) hypertension: Secondary | ICD-10-CM | POA: Diagnosis present

## 2023-08-13 DIAGNOSIS — Z5321 Procedure and treatment not carried out due to patient leaving prior to being seen by health care provider: Secondary | ICD-10-CM | POA: Insufficient documentation

## 2023-08-13 LAB — CBC
HCT: 44 % (ref 36.0–46.0)
Hemoglobin: 15.2 g/dL — ABNORMAL HIGH (ref 12.0–15.0)
MCH: 31.4 pg (ref 26.0–34.0)
MCHC: 34.5 g/dL (ref 30.0–36.0)
MCV: 90.9 fL (ref 80.0–100.0)
Platelets: 210 10*3/uL (ref 150–400)
RBC: 4.84 MIL/uL (ref 3.87–5.11)
RDW: 11.9 % (ref 11.5–15.5)
WBC: 6.8 10*3/uL (ref 4.0–10.5)
nRBC: 0 % (ref 0.0–0.2)

## 2023-08-13 LAB — TROPONIN I (HIGH SENSITIVITY): Troponin I (High Sensitivity): 5 ng/L (ref <18)

## 2023-08-13 LAB — BASIC METABOLIC PANEL WITH GFR
Anion gap: 7 (ref 5–15)
BUN: 15 mg/dL (ref 8–23)
CO2: 27 mmol/L (ref 22–32)
Calcium: 9.4 mg/dL (ref 8.9–10.3)
Chloride: 107 mmol/L (ref 98–111)
Creatinine, Ser: 0.98 mg/dL (ref 0.44–1.00)
GFR, Estimated: >60 mL/min (ref >60)
Glucose, Bld: 98 mg/dL (ref 70–99)
Potassium: 4.3 mmol/L (ref 3.5–5.1)
Sodium: 141 mmol/L (ref 135–145)

## 2023-08-13 NOTE — ED Triage Notes (Signed)
 Pt to ED via POV c/o fatigue x 1 month and hypertension. Pt reports feeling tired for about a month. Has talked to PCP about same and \\said  they have schedueld a CT. Pt also reports looking at her pulse and blood pressure to figure out why she was feeling tires. Pulse was 45 and BP was 184/97. No hx of HTN

## 2023-08-14 ENCOUNTER — Other Ambulatory Visit: Payer: Self-pay | Admitting: Family Medicine

## 2023-08-14 ENCOUNTER — Encounter: Payer: Self-pay | Admitting: Family Medicine

## 2023-08-14 ENCOUNTER — Emergency Department
Admission: EM | Admit: 2023-08-14 | Discharge: 2023-08-14 | Discharge status: Against Medical Advice | Attending: Emergency Medicine | Admitting: Emergency Medicine

## 2023-08-14 DIAGNOSIS — Z1231 Encounter for screening mammogram for malignant neoplasm of breast: Secondary | ICD-10-CM

## 2023-08-14 DIAGNOSIS — Z0000 Encounter for general adult medical examination without abnormal findings: Secondary | ICD-10-CM

## 2023-08-14 NOTE — ED Notes (Signed)
 No answer when called several times from lobby

## 2023-08-18 ENCOUNTER — Other Ambulatory Visit: Payer: Self-pay | Admitting: Family Medicine

## 2023-08-18 DIAGNOSIS — R1032 Left lower quadrant pain: Secondary | ICD-10-CM

## 2023-08-24 ENCOUNTER — Ambulatory Visit

## 2023-08-24 ENCOUNTER — Ambulatory Visit
Admission: RE | Admit: 2023-08-24 | Discharge: 2023-08-24 | Disposition: A | Discharge status: Home / Self Care | Source: Ambulatory Visit | Attending: Family Medicine | Admitting: Family Medicine

## 2023-08-24 DIAGNOSIS — R1032 Left lower quadrant pain: Secondary | ICD-10-CM | POA: Diagnosis present

## 2023-08-24 DIAGNOSIS — J439 Emphysema, unspecified: Secondary | ICD-10-CM | POA: Insufficient documentation

## 2023-08-24 DIAGNOSIS — Z0000 Encounter for general adult medical examination without abnormal findings: Secondary | ICD-10-CM | POA: Diagnosis present

## 2023-08-24 DIAGNOSIS — Z122 Encounter for screening for malignant neoplasm of respiratory organs: Secondary | ICD-10-CM | POA: Diagnosis present

## 2023-08-24 DIAGNOSIS — I2510 Atherosclerotic heart disease of native coronary artery without angina pectoris: Secondary | ICD-10-CM | POA: Insufficient documentation

## 2023-08-24 DIAGNOSIS — I700 Atherosclerosis of aorta: Secondary | ICD-10-CM | POA: Insufficient documentation

## 2023-08-24 DIAGNOSIS — F17210 Nicotine dependence, cigarettes, uncomplicated: Secondary | ICD-10-CM | POA: Insufficient documentation

## 2023-09-24 ENCOUNTER — Other Ambulatory Visit: Payer: Self-pay | Admitting: Medical Genetics

## 2023-09-26 ENCOUNTER — Other Ambulatory Visit
Admission: RE | Admit: 2023-09-26 | Discharge: 2023-09-26 | Disposition: A | Discharge status: Home / Self Care | Payer: Self-pay | Source: Ambulatory Visit | Attending: Medical Genetics | Admitting: Medical Genetics

## 2023-10-01 ENCOUNTER — Ambulatory Visit
Admission: RE | Admit: 2023-10-01 | Discharge: 2023-10-01 | Disposition: A | Discharge status: Home / Self Care | Source: Ambulatory Visit | Attending: Family Medicine | Admitting: Family Medicine

## 2023-10-01 DIAGNOSIS — Z780 Asymptomatic menopausal state: Secondary | ICD-10-CM | POA: Diagnosis not present

## 2023-10-01 DIAGNOSIS — Z1231 Encounter for screening mammogram for malignant neoplasm of breast: Secondary | ICD-10-CM | POA: Diagnosis present

## 2023-10-01 DIAGNOSIS — Z13820 Encounter for screening for osteoporosis: Secondary | ICD-10-CM | POA: Insufficient documentation

## 2023-10-01 DIAGNOSIS — Z0000 Encounter for general adult medical examination without abnormal findings: Secondary | ICD-10-CM

## 2023-10-02 ENCOUNTER — Inpatient Hospital Stay
Admission: RE | Admit: 2023-10-02 | Discharge: 2023-10-02 | Disposition: A | Discharge status: Home / Self Care | Payer: Self-pay | Source: Ambulatory Visit | Attending: Family Medicine | Admitting: Family Medicine

## 2023-10-02 ENCOUNTER — Other Ambulatory Visit: Payer: Self-pay | Admitting: *Deleted

## 2023-10-02 DIAGNOSIS — Z1231 Encounter for screening mammogram for malignant neoplasm of breast: Secondary | ICD-10-CM

## 2023-10-02 NOTE — Progress Notes (Unsigned)
   Cardiology Clinic Note   Date: 10/05/2023 ID: Kaliana Albino, DOB [birth year], MRN [id number]  Primary Care Provider: Odell Chard, Edra GRADE, MD   Chief Complaint   Bonnie Franklin is a 68 y.o. female who presents to the clinic today for new patient visit.   Patient Profile      Past medical history significant for: Coronary calcification/aortic atherosclerosis. Chest CT 08/24/2023: Three-vessel coronary artery calcification, aortic atherosclerosis.  Normal heart size without pericardial effusion. Hashimoto.  Tobacco abuse. Occasional cigarette. Daily vaping.      History of Present Illness    Today, patient presents for evaluation of fatigue, shortness of breath and chest pain. Patient describes a 2 month history of fatigue and shortness of breath causing her to have decreased activity tolerance. She is a very active individual push mowing her 1 acre property and working at [employer]. She states over the last couple of months she feels significant fatigue that is different from just feeling tired. She reports feeling winded with less exertion than previous. No lower extremity edema, orthopnea or PND. She has occasional palpitations but this is not new for her.  She also reports left sided chest pain described as an aching sensation that can occur when she is excited. She smokes 1-2 cigarettes a day but not every day. She also vapes daily. Her family history includes CAD on her maternal side with her mother having stents placed in her 67s, her grandmother undergoing CABG, and her aunt with an MI in her 52s.     ROS: All other systems reviewed and are otherwise negative except as noted in History of Present Illness.  EKGs/Labs Reviewed    EKG Interpretation Date/Time:  Monday October 05 2023 09:01:35 EDT Ventricular Rate:  58 PR Interval:  128 QRS Duration:  84 QT Interval:  438 QTC Calculation: 429 R Axis:   42  Text Interpretation: Sinus bradycardia When compared with ECG of  13-Aug-2023 20:55, No significant change was found Confirmed by Loistine Sober [phone]) on 10/05/2023 9:10:45 AM   08/13/2023: BUN 15; Creatinine, Ser 0.98; Potassium 4.3; Sodium 141   08/13/2023: Hemoglobin 15.2; WBC 6.8    Physical Exam    VS:  BP 120/72   Pulse (!) 58   Ht 5' 9.5 (1.765 m)   Wt 228 lb 6.4 oz (103.6 kg)   SpO2 97%   BMI 33.25 kg/m  , BMI Body mass index is 33.25 kg/m.  GEN: Well nourished, well developed, in no acute distress. Neck: No JVD or carotid bruits. Cardiac:  RRR.  No murmur. No rubs or gallops.   Respiratory:  Respirations regular and unlabored. Clear to auscultation without rales, wheezing or rhonchi. GI: Soft, nontender, nondistended. Extremities: Radials/DP/PT 2+ and equal bilaterally. No clubbing or cyanosis. No edema  Skin: Warm and dry, no rash. Neuro: Strength intact.  Assessment & Plan   Chest pain/dyspnea/fatigue/coronary artery calcification/aortic atherosclerosis Chest CT June 2025 showed three-vessel coronary artery calcification and aortic atherosclerosis.  Patient reports a 2 month history of fatigue, dyspnea with less exertion than previous, and chest pain. She has a strong family history of CAD on her maternal side. EKG without acute changes.  - Schedule echo and coronary CTA.   Tobacco abuse Patient smokes 1-2 cigarettes on occasion. She vapes daily.  - Encouraged cessation.   Disposition: BMP today. Coronary CTA, echo. Return in 6-8 weeks or sooner as needed.          Signed, Sober HERO. Jerrald Doverspike, DNP, NP-C

## 2023-10-05 ENCOUNTER — Encounter: Payer: Self-pay | Admitting: Student

## 2023-10-05 ENCOUNTER — Ambulatory Visit: Attending: Student | Admitting: Student

## 2023-10-05 VITALS — BP 120/72 | HR 58 | Ht 69.5 in | Wt 228.4 lb

## 2023-10-05 DIAGNOSIS — Z720 Tobacco use: Secondary | ICD-10-CM

## 2023-10-05 DIAGNOSIS — R0609 Other forms of dyspnea: Secondary | ICD-10-CM

## 2023-10-05 DIAGNOSIS — I2510 Atherosclerotic heart disease of native coronary artery without angina pectoris: Secondary | ICD-10-CM

## 2023-10-05 DIAGNOSIS — R072 Precordial pain: Secondary | ICD-10-CM

## 2023-10-05 DIAGNOSIS — Z01818 Encounter for other preprocedural examination: Secondary | ICD-10-CM | POA: Diagnosis not present

## 2023-10-05 MED ORDER — METOPROLOL TARTRATE 25 MG PO TABS
ORAL_TABLET | ORAL | 0 refills | Status: DC
Start: 2023-10-05 — End: 2023-11-25

## 2023-10-05 NOTE — Patient Instructions (Signed)
 Medication Instructions:  Your physician recommends that you continue on your current medications as directed. Please refer to the Current Medication list given to you today.    Take 25 mg Metoprolol  once, 2 hours prior to CT Angio Procedure. *If you need a refill on your cardiac medications before your next appointment, please call your pharmacy*  Lab Work: Your provider would like for you to have following labs drawn today BMet.   If you have labs (blood work) drawn today and your tests are completely normal, you will receive your results only by: MyChart Message (if you have MyChart) OR A paper copy in the mail If you have any lab test that is abnormal or we need to change your treatment, we will call you to review the results.  Testing/Procedures: Your physician has requested that you have an echocardiogram. Echocardiography is a painless test that uses sound waves to create images of your heart. It provides your doctor with information about the size and shape of your heart and how well your heart's chambers and valves are working.   You may receive an ultrasound enhancing agent through an IV if needed to better visualize your heart during the echo. This procedure takes approximately one hour.  There are no restrictions for this procedure.  This will take place at [address] [facility] [address], [place] [address]  Please note: We ask at that you not bring children with you during ultrasound (echo/ vascular) testing. Due to room size and safety concerns, children are not allowed in the ultrasound rooms during exams. Our front office staff cannot provide observation of children in our lobby area while testing is being conducted. An adult accompanying a patient to their appointment will only be allowed in the ultrasound room at the discretion of the ultrasound technician under special circumstances. We apologize for any inconvenience.   Your cardiac CT will be scheduled at  :   [facility] [address] [place], [place] [address] [phone]  When scheduled at  [facility], please arrive 15 mins early for check-in and test prep.  Please follow these instructions carefully (unless otherwise directed):  An IV will be required for this test and Nitroglycerin will be given.  Hold all erectile dysfunction medications at least 3 days (72 hrs) prior to test. (Ie viagra, cialis, sildenafil, tadalafil, etc)   On the Night Before the Test: Be sure to Drink plenty of water. Do not consume any caffeinated/decaffeinated beverages or chocolate 12 hours prior to your test. Do not take any antihistamines 12 hours prior to your test. If the patient has contrast allergy: Patient will need a prescription for Prednisone and very clear instructions (as follows): Prednisone 50 mg - take 13 hours prior to test Take another Prednisone 50 mg 7 hours prior to test Take another Prednisone 50 mg 1 hour prior to test Take Benadryl 50 mg 1 hour prior to test Patient must complete all four doses of above prophylactic medications. Patient will need a ride after test due to Benadryl.  On the Day of the Test: Drink plenty of water until 1 hour prior to the test. Do not eat any food 1 hour prior to test. You may take your regular medications prior to the test.  Take metoprolol  (Lopressor ) two hours prior to test. If you take Furosemide/Hydrochlorothiazide/Spironolactone/Chlorthalidone, please HOLD on the morning of the test. Patients who wear a continuous glucose monitor MUST remove the device prior to scanning.  FEMALES- please wear underwire-free bra if available, avoid dresses & tight clothing       After the Test: Drink plenty of water. After receiving IV contrast, you may experience a mild flushed feeling. This is normal. On occasion, you may experience a mild rash up to 24 hours after the test. This is not dangerous. If this  occurs, you can take Benadryl 25 mg, Zyrtec, Claritin, or Allegra and increase your fluid intake. (Patients taking Tikosyn should avoid Benadryl, and may take Zyrtec, Claritin, or Allegra) If you experience trouble breathing, this can be serious. If it is severe call [phone] IMMEDIATELY. If it is mild, please call our office.  We will call to schedule your test 2-4 weeks out understanding that some insurance companies will need an authorization prior to the service being performed.   For more information and frequently asked questions, please visit our website : [email or web address]  For non-scheduling related questions, please contact the cardiac imaging nurse navigator should you have any questions/concerns: Cardiac Imaging Nurse Navigators Direct Office Dial: [phone]   For scheduling needs, including cancellations and rescheduling, please call [place], [phone].    Follow-Up: At [facility], you and your health needs are our priority.  As part of our continuing mission to provide you with exceptional heart care, our providers are all part of one team.  This team includes your primary Cardiologist (physician) and Advanced Practice Providers or APPs (Physician Assistants and Nurse Practitioners) who all work together to provide you with the care you need, when you need it.  Your next appointment:   6 - 8 week(s)  Provider:   Barnie Hila, NP    We recommend signing up for the patient portal called MyChart.  Sign up information is provided on this After Visit Summary.  MyChart is used to connect with patients for Virtual Visits (Telemedicine).  Patients are able to view lab/test results, encounter notes, upcoming appointments, etc.  Non-urgent messages can be sent to your provider as well.   To learn more about what you can do with MyChart, go to [email or web address].

## 2023-10-06 ENCOUNTER — Ambulatory Visit: Payer: Self-pay | Admitting: Student

## 2023-10-06 LAB — BASIC METABOLIC PANEL WITH GFR
BUN/Creatinine Ratio: 15 (ref 12–28)
BUN: 15 mg/dL (ref 8–27)
CO2: 20 mmol/L (ref 20–29)
Calcium: 9.3 mg/dL (ref 8.7–10.3)
Chloride: 103 mmol/L (ref 96–106)
Creatinine, Ser: 1.02 mg/dL — ABNORMAL HIGH (ref 0.57–1.00)
Glucose: 102 mg/dL — ABNORMAL HIGH (ref 70–99)
Potassium: 4.5 mmol/L (ref 3.5–5.2)
Sodium: 140 mmol/L (ref 134–144)
eGFR: 61 mL/min/1.73 (ref >59)

## 2023-10-06 LAB — GENECONNECT MOLECULAR SCREEN: Genetic Analysis Overall Interpretation: NEGATIVE

## 2023-10-06 NOTE — Progress Notes (Signed)
 Last read by Montie Arvid Cower at 10:26AM on 10/06/2023.

## 2023-10-09 ENCOUNTER — Telehealth (HOSPITAL_COMMUNITY): Payer: Self-pay | Admitting: *Deleted

## 2023-10-09 NOTE — Telephone Encounter (Signed)
 Attempted to call patient regarding upcoming cardiac CT appointment. Left message on voicemail with name and callback number  Larey Brick RN Navigator Cardiac Imaging [facility] [phone] Office [phone] Cell

## 2023-10-09 NOTE — Telephone Encounter (Signed)
Patient returning call about her upcoming cardiac imaging study; pt verbalizes understanding of appt date/time, parking situation and where to check in, pre-test NPO status and verified current allergies; name and call back number provided for further questions should they arise  Larey Brick RN Navigator Cardiac Imaging [facility] [phone] office [phone] cell

## 2023-10-12 ENCOUNTER — Ambulatory Visit
Admission: RE | Admit: 2023-10-12 | Discharge: 2023-10-12 | Disposition: A | Discharge status: Home / Self Care | Source: Ambulatory Visit | Attending: Student | Admitting: Student

## 2023-10-12 DIAGNOSIS — R072 Precordial pain: Secondary | ICD-10-CM | POA: Diagnosis present

## 2023-10-12 MED ORDER — NITROGLYCERIN 0.4 MG SL SUBL
0.8000 mg | SUBLINGUAL_TABLET | Freq: Once | SUBLINGUAL | Status: AC
  Administered 2023-10-12: 0.8 mg via SUBLINGUAL
  Filled 2023-10-12: qty 25

## 2023-10-12 MED ORDER — IOHEXOL 350 MG/ML SOLN
100.0000 mL | Freq: Once | INTRAVENOUS | Status: AC | PRN
  Administered 2023-10-12: 100 mL via INTRAVENOUS

## 2023-10-12 NOTE — Progress Notes (Signed)
 Patient tolerated CT well. Vital signs stable encourage to drink water throughout day.Reasons explained and verbalized understanding. Ambulated steady gait.

## 2023-10-15 MED ORDER — ASPIRIN 81 MG PO TBEC: 81.0000 mg | DELAYED_RELEASE_TABLET | Freq: Every day | ORAL | 3 refills | Status: AC

## 2023-10-15 NOTE — Addendum Note (Signed)
 Addended by: Stormy Sabol A on: 10/15/2023 07:53 AM   Modules accepted: Orders

## 2023-10-15 NOTE — Progress Notes (Signed)
 Last read by Montie Arvid Cower at 9:23AM on 10/15/2023.

## 2023-10-20 NOTE — Progress Notes (Signed)
 Last read by Montie Arvid Cower at 10:32AM on 10/19/2023.

## 2023-11-09 ENCOUNTER — Ambulatory Visit: Attending: Student

## 2023-11-09 DIAGNOSIS — R0609 Other forms of dyspnea: Secondary | ICD-10-CM | POA: Diagnosis not present

## 2023-11-09 LAB — ECHOCARDIOGRAM COMPLETE
AR max vel: 2.76 cm2
AV Area VTI: 3.08 cm2
AV Area mean vel: 2.76 cm2
AV Mean grad: 3 mmHg
AV Peak grad: 6.8 mmHg
Ao pk vel: 1.3 m/s
Area-P 1/2: 2.73 cm2
S' Lateral: 3.55 cm

## 2023-11-10 ENCOUNTER — Ambulatory Visit: Admitting: Cardiovascular Disease

## 2023-11-11 NOTE — Progress Notes (Signed)
 Last read by Montie Arvid Cower at 10:48AM on 11/11/2023.

## 2023-11-17 NOTE — Progress Notes (Deleted)
   Cardiology Clinic Note   Date: 11/17/2023 ID: Bonnie Franklin, DOB 1955/08/02, MRN [id number]  Primary Cardiologist:  None  Chief Complaint   Bonnie Franklin is a 68 y.o. female who presents to the clinic today for ***  Patient Profile       Past medical history significant for: Nonobstructive CAD. Chest CT 08/24/2023: Three-vessel coronary artery calcification, aortic atherosclerosis.  Normal heart size without pericardial effusion.*** Coronary CTA 10/12/2023: Coronary Calcium score 1389 (99th percentile).  Extensive CAD but no obstructive disease noted.  FFR suggested no hemodynamically significant stenosis. Shortness of breath/fatigue. Echo 11/09/2023: EF 55 to 60%.  No RWMA.  Grade I DD.  Normal global strain.  Normal RV size/function. Hashimoto.  Tobacco abuse. Occasional cigarette. Daily vaping.    Patient was seen as a new patient on 10/05/2023 with complaints of fatigue, shortness of breath, chest pain.  She reported a 61-month history of fatigue and shortness of breath causing her to have decreased activity tolerance.  She reported being a very active individual push mowing her 1 acre property and working at [employer].  She also reported feeling winded with less exertion than previous.  She underwent coronary CTA which demonstrated a calcium score of 1389 with extensive CAD but no obstructive disease.  Echo showed normal LV/RV function with Grade I DD.     History of Present Illness    Today, patient ***  Nonobstructive CAD/DOE Coronary CTA July 2025 demonstrated coronary calcium score of 1389 (99th percentile), extensive CAD but no obstructive disease noted verified with FFR.  Echo demonstrated normal LV/RV function with Grade I DD.  Patient*** - Continue aspirin .  Hyperlipidemia *** -***   Tobacco abuse Patient smokes 1-2 cigarettes on occasion. She vapes daily.  - Encouraged cessation.   ROS: All other systems reviewed and are otherwise negative except as noted in  History of Present Illness.  EKGs/Labs Reviewed        10/05/2023: BUN 15; Creatinine, Ser 1.02; Potassium 4.5; Sodium 140   08/13/2023: Hemoglobin 15.2; WBC 6.8   No results found for requested labs within last 365 days.   No results found for requested labs within last 365 days.  ***  Risk Assessment/Calculations    {Does this patient have ATRIAL FIBRILLATION?:[phone]} No BP recorded.  {Refresh Note OR Click here to enter BP  :1}***        Physical Exam    VS:  There were no vitals taken for this visit. , BMI There is no height or weight on file to calculate BMI.  GEN: Well nourished, well developed, in no acute distress. Neck: No JVD or carotid bruits. Cardiac: *** RRR. *** No murmur. No rubs or gallops.   Respiratory:  Respirations regular and unlabored. Clear to auscultation without rales, wheezing or rhonchi. GI: Soft, nontender, nondistended. Extremities: Radials/DP/PT 2+ and equal bilaterally. No clubbing or cyanosis. No edema ***  Skin: Warm and dry, no rash. Neuro: Strength intact.  Assessment & Plan   ***  Disposition: ***     {Are you ordering a CV Procedure (e.g. stress test, cath, DCCV, TEE, etc)?   Press F2        :[id number]}   Signed, Barnie HERO. Laquisha Northcraft, DNP, NP-C

## 2023-11-20 ENCOUNTER — Ambulatory Visit: Admitting: Student

## 2023-11-21 NOTE — Progress Notes (Unsigned)
 Cardiology Clinic Note   Date: 11/25/2023 ID: Raia Amico, DOB 02-25-1956, MRN [id number]  Primary Cardiologist:  None  Chief Complaint   Bonnie Franklin is a 68 y.o. female who presents to the clinic today for follow up after testing.   Patient Profile       Past medical history significant for: Nonobstructive CAD. Coronary CTA 10/12/2023: Coronary Calcium score 1389 (99th percentile).  Extensive CAD but no obstructive disease noted.  FFR suggested no hemodynamically significant stenosis. Shortness of breath/fatigue. Echo 11/09/2023: EF 55 to 60%.  No RWMA.  Grade I DD.  Normal global strain.  Normal RV size/function. Hyperlipidemia. Lipid panel 08/04/2023: LDL 105, HDL 36, TG 115, total 169. Hashimoto.  Tobacco abuse. Occasional cigarette. Daily vaping.    Patient was seen as a new patient on 10/05/2023 with complaints of fatigue, shortness of breath, chest pain.  She reported a 42-month history of fatigue and shortness of breath causing her to have decreased activity tolerance.  She reported being a very active individual push mowing her 1 acre property and working at [employer].  She also reported feeling winded with less exertion than previous.  She underwent coronary CTA which demonstrated a calcium score of 1389 with extensive CAD but no obstructive disease.  Echo showed normal LV/RV function with Grade I DD.     History of Present Illness    Today, patient reports she is feeling much improved from last visit. She is no longer experiencing fatigue or dyspnea like she was previously. She has been improving her diet by decreasing carbs. She is also down to once cigarette a day. She plans to tackle vaping cessation next. She found out her insurance will cover a [employer] membership and she plans on joining to increase physical activity. She would like to continue these changes for a few months before starting on a statin. Discussed results of echo and coronary CTA in detail and all questions  answered.      ROS: All other systems reviewed and are otherwise negative except as noted in History of Present Illness.  EKGs/Labs Reviewed       EKG not ordered today.   10/05/2023: BUN 15; Creatinine, Ser 1.02; Potassium 4.5; Sodium 140   08/13/2023: Hemoglobin 15.2; WBC 6.8    Physical Exam    VS:  BP 120/70 (BP Location: Left Arm, Patient Position: Sitting, Cuff Size: Normal)   Pulse 86   Ht 5' 9 (1.753 m)   Wt 228 lb 6.4 oz (103.6 kg)   SpO2 96%   BMI 33.73 kg/m  , BMI Body mass index is 33.73 kg/m.  GEN: Well nourished, well developed, in no acute distress. Neck: No JVD or carotid bruits. Cardiac:  RRR.  No murmur. No rubs or gallops.   Respiratory:  Respirations regular and unlabored. Clear to auscultation without rales, wheezing or rhonchi. GI: Soft, nontender, nondistended. Extremities: Radials/DP/PT 2+ and equal bilaterally. No clubbing or cyanosis. No edema  Skin: Warm and dry, no rash. Neuro: Strength intact.  Assessment & Plan   Nonobstructive CAD/DOE Coronary CTA July 2025 demonstrated coronary calcium score of 1389 (99th percentile), extensive CAD but no obstructive disease noted verified with FFR.  Echo demonstrated normal LV/RV function with Grade I DD.  Patient reports she is feeling improved since last visit. She is no longer experiencing fatigue. Dyspnea is improved. She is working on improving her diet by decreasing carbs.  - Continue aspirin .  Hyperlipidemia LDL 105 May 2025, not at goal. Patient  is working on improving diet and increasing physical activity. She plans on joining the [employer]. She would ike to hold off on starting a statin while she makes these changes.  - Continue lifestyle changes.  - Fasting lipid panel in 6 months.    Tobacco abuse Patient down to 1 cigarette a day. She plans to try vaping cessation next.  - Encouraged cessation.  Disposition: Lipid panel in 6 months. Return to see Dr. Gollan in 1 year.           Signed, Barnie HERO. Grizel Vesely, DNP, NP-C

## 2023-11-25 ENCOUNTER — Ambulatory Visit: Attending: Student | Admitting: Student

## 2023-11-25 ENCOUNTER — Encounter: Payer: Self-pay | Admitting: Student

## 2023-11-25 VITALS — BP 120/70 | HR 86 | Ht 69.0 in | Wt 228.4 lb

## 2023-11-25 DIAGNOSIS — Z720 Tobacco use: Secondary | ICD-10-CM

## 2023-11-25 DIAGNOSIS — E785 Hyperlipidemia, unspecified: Secondary | ICD-10-CM

## 2023-11-25 DIAGNOSIS — I2510 Atherosclerotic heart disease of native coronary artery without angina pectoris: Secondary | ICD-10-CM | POA: Diagnosis not present

## 2023-11-25 DIAGNOSIS — Z79899 Other long term (current) drug therapy: Secondary | ICD-10-CM

## 2023-11-25 NOTE — Patient Instructions (Signed)
 Medication Instructions:  Your physician recommends that you continue on your current medications as directed. Please refer to the Current Medication list given to you today.   *If you need a refill on your cardiac medications before your next appointment, please call your pharmacy*  Lab Work: Your provider would like for you to return in 6 months to have the following labs drawn: Lipid Panel.   Please go to [facility] [address] ([facility] [address], [place] [address] You do not need an appointment.  They are open from 8 am- 4:30 pm.  Lunch from 1:00 pm- 2:00 pm You DO need to be fasting.   You may also go to one of the following [facility]:  [address] [place], [place] [address] Phone: [phone] Lab hours: Mon-Fri 8 am- 5 pm    Lunch 12 pm- 1 pm  [address] [place],  [place]  [address]  [place]  Phone: [phone] Lab hours: 7 am- 4 pm Lunch 12 pm-1 pm   [address] [place],  [place]  [address]  [place]  Phone: [phone] Lab hours: Mon-Fri 8 am- 5 pm    Lunch 12 pm- 1 pm  If you have labs (blood work) drawn today and your tests are completely normal, you will receive your results only by: MyChart Message (if you have MyChart) OR A paper copy in the mail If you have any lab test that is abnormal or we need to change your treatment, we will call you to review the results.  Testing/Procedures: None ordered at this time   Follow-Up: At [facility], you and your health needs are our priority.  As part of our continuing mission to provide you with exceptional heart care, our providers are all part of one team.  This team includes your primary Cardiologist (physician) and Advanced Practice Providers or APPs (Physician Assistants and Nurse Practitioners) who all work together to provide you with the care you need, when you need it.  Your next appointment:   1 year(s)  Provider:   Timothy Gollan, MD    We recommend signing up for the  patient portal called MyChart.  Sign up information is provided on this After Visit Summary.  MyChart is used to connect with patients for Virtual Visits (Telemedicine).  Patients are able to view lab/test results, encounter notes, upcoming appointments, etc.  Non-urgent messages can be sent to your provider as well.   To learn more about what you can do with MyChart, go to [email or web address].

## 2024-02-06 DIAGNOSIS — E039 Hypothyroidism, unspecified: Secondary | ICD-10-CM | POA: Diagnosis not present

## 2024-02-06 DIAGNOSIS — E669 Obesity, unspecified: Secondary | ICD-10-CM | POA: Diagnosis not present

## 2024-02-06 DIAGNOSIS — Z7982 Long term (current) use of aspirin: Secondary | ICD-10-CM | POA: Diagnosis not present

## 2024-02-06 DIAGNOSIS — E785 Hyperlipidemia, unspecified: Secondary | ICD-10-CM | POA: Diagnosis not present

## 2024-02-06 DIAGNOSIS — Z809 Family history of malignant neoplasm, unspecified: Secondary | ICD-10-CM | POA: Diagnosis not present

## 2024-02-06 DIAGNOSIS — Z8249 Family history of ischemic heart disease and other diseases of the circulatory system: Secondary | ICD-10-CM | POA: Diagnosis not present

## 2024-02-06 DIAGNOSIS — Z833 Family history of diabetes mellitus: Secondary | ICD-10-CM | POA: Diagnosis not present

## 2024-02-06 DIAGNOSIS — I499 Cardiac arrhythmia, unspecified: Secondary | ICD-10-CM | POA: Diagnosis not present

## 2024-02-06 DIAGNOSIS — Z823 Family history of stroke: Secondary | ICD-10-CM | POA: Diagnosis not present
